# Patient Record
Sex: Female | Born: 1995
Health system: Southern US, Community
[De-identification: ages and names within clinical notes are randomized; demographics above are authoritative.]

## PROBLEM LIST (undated history)

## (undated) DIAGNOSIS — Z789 Other specified health status: Secondary | ICD-10-CM

## (undated) HISTORY — PX: NO PAST SURGERIES: SHX2092

## (undated) HISTORY — DX: Other specified health status: Z78.9

---
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
## 2018-08-21 ENCOUNTER — Ambulatory Visit (INDEPENDENT_AMBULATORY_CARE_PROVIDER_SITE_OTHER): Payer: BLUE CROSS/BLUE SHIELD

## 2018-08-21 ENCOUNTER — Other Ambulatory Visit: Payer: Self-pay

## 2018-08-21 ENCOUNTER — Encounter: Payer: Self-pay | Admitting: Emergency Medicine

## 2018-08-21 ENCOUNTER — Ambulatory Visit
Admission: EM | Admit: 2018-08-21 | Discharge: 2018-08-21 | Disposition: A | Discharge status: Home / Self Care | Payer: BLUE CROSS/BLUE SHIELD | Attending: Family Medicine | Admitting: Family Medicine

## 2018-08-21 DIAGNOSIS — M79671 Pain in right foot: Secondary | ICD-10-CM

## 2018-08-21 NOTE — Discharge Instructions (Signed)
Ice. Rest. Drink plenty of fluids. Over the counter ibuprofen. Post-operative shoe for one week. Gradually increase activity as tolerated.   Follow up with podiatry as needed for continued pain.   Follow up with your primary care physician this week as needed. Return to Urgent care for new or worsening concerns.

## 2018-08-21 NOTE — ED Provider Notes (Signed)
[facility] ____________________________________________  Time seen: Approximately 5:22 PM  I have reviewed the triage vital signs and the nursing notes.   HISTORY  Chief Complaint Foot Pain   HPI Debbie Booker is a 23 y.o. female presenting for evaluation of right foot pain present for the last 1 week.  States pain is predominantly with putting weight on right foot and with activity.  States minimal pain when at rest.  Denies pain radiation, paresthesias or skin changes.  States that she has been trying to increase running in the last few weeks, stating this is a new activity for her.  Denies any known injury or trauma.  Unresolved with rest and ice.  Denies other alleviating measures.  Denies history of same.  Reports otherwise doing well denies other complaints.  Patient's last menstrual period was 08/07/2018.  Denies pregnancy.   History reviewed. No pertinent past medical history.  There are no active problems to display for this patient.   History reviewed. No pertinent surgical history.   No current facility-administered medications for this encounter.  No current outpatient medications on file.  Allergies Patient has no known allergies.  History reviewed. No pertinent family history.  Social History Social History   Tobacco Use  . Smoking status: Never Smoker  . Smokeless tobacco: Never Used  Substance Use Topics  . Alcohol use: Never    Frequency: Never  . Drug use: Never    Review of Systems Constitutional: No fever Cardiovascular: Denies chest pain. Respiratory: Denies shortness of breath. Musculoskeletal: Positive right foot pain. Skin: Negative for rash.   ____________________________________________   PHYSICAL EXAM:  VITAL SIGNS: ED Triage Vitals  Enc Vitals Group     BP 08/21/18 1550 115/73     Pulse Rate 08/21/18 1550 60     Resp 08/21/18 1550 18     Temp 08/21/18 1550 98.3 F (36.8 C)     Temp Source 08/21/18  1550 Oral     SpO2 08/21/18 1550 100 %     Weight 08/21/18 1549 125 lb (56.7 kg)     Height 08/21/18 1549 5\' 3"  (1.6 m)     Head Circumference --      Peak Flow --      Pain Score 08/21/18 1549 6     Pain Loc --      Pain Edu? --      Excl. in [place]? --     Constitutional: Alert and oriented. Well appearing and in no acute distress. ENT      Head: Normocephalic and atraumatic. Cardiovascular: Normal rate, regular rhythm. Grossly normal heart sounds.  Good peripheral circulation. Respiratory: Normal respiratory effort without tachypnea nor retractions. Breath sounds are clear and equal bilaterally. No wheezes, rales, rhonchi. Musculoskeletal:  Bilateral pedal pulses equal and easily palpated. Except: Right dorsal midfoot at distal second metatarsal moderate tenderness to direct palpation, no swelling, no ecchymosis, skin intact, full range of motion present to right foot, right lower extremity otherwise nontender. Neurologic:  Normal speech and language.  Skin:  Skin is warm, dry and intact. No rash noted. Psychiatric: Mood and affect are normal. Speech and behavior are normal. Patient exhibits appropriate insight and judgment   ___________________________________________   LABS (all labs ordered are listed, but only abnormal results are displayed)  Labs Reviewed - No data to display ____________________________________________  RADIOLOGY  Dg Foot Complete Right  Result Date: 08/21/2018 CLINICAL DATA:  23 year old female with acute RIGHT foot pain for 1 week following running.  Initial encounter. EXAM: RIGHT FOOT COMPLETE - 3+ VIEW COMPARISON:  None. FINDINGS: There is no evidence of fracture or dislocation. There is no evidence of arthropathy or other focal bone abnormality. Soft tissues are unremarkable. IMPRESSION: Negative. Electronically Signed   By: Harmon PierJeffrey  Hu M.D.   On: 08/21/2018 16:45   ____________________________________________   PROCEDURES Procedures   INITIAL  IMPRESSION / ASSESSMENT AND PLAN / ED COURSE  Pertinent labs & imaging results that were available during my care of the patient were reviewed by me and considered in my medical decision making (see chart for details).  Well-appearing patient.  No acute distress.  Presenting for evaluation of right foot pain, denies known direct injury.  Right foot x-ray as above per radiologist, negative.  Suspect tendinitis.  Postop shoe given, encouraged ice, rest, over-the-counter ibuprofen as needed and monitoring.  Follow-up with podiatry as needed for continued pain.  Discussed follow up with Primary care physician this week. Discussed follow up and return parameters including no resolution or any worsening concerns. Patient verbalized understanding and agreed to plan.   ____________________________________________   FINAL CLINICAL IMPRESSION(S) / ED DIAGNOSES  Final diagnoses:  Right foot pain     ED Discharge Orders    None       Note: This dictation was prepared with Dragon dictation along with smaller phrase technology. Any transcriptional errors that result from this process are unintentional.         Renford DillsMiller, Israel Wunder, NP 08/21/18 1847

## 2018-08-21 NOTE — ED Triage Notes (Signed)
Patient c/o right foot pain that started 1 week ago after she was running. She denies injury.

## 2018-12-15 ENCOUNTER — Other Ambulatory Visit: Payer: Self-pay

## 2018-12-15 DIAGNOSIS — Z20822 Contact with and (suspected) exposure to covid-19: Secondary | ICD-10-CM

## 2018-12-16 LAB — NOVEL CORONAVIRUS, NAA: SARS-CoV-2, NAA: NOT DETECTED

## 2018-12-21 ENCOUNTER — Other Ambulatory Visit: Payer: Self-pay

## 2018-12-21 DIAGNOSIS — Z20822 Contact with and (suspected) exposure to covid-19: Secondary | ICD-10-CM

## 2018-12-22 LAB — NOVEL CORONAVIRUS, NAA: SARS-CoV-2, NAA: DETECTED — AB

## 2018-12-28 ENCOUNTER — Other Ambulatory Visit: Payer: Self-pay

## 2018-12-28 ENCOUNTER — Encounter: Payer: Self-pay | Admitting: Emergency Medicine

## 2018-12-28 ENCOUNTER — Ambulatory Visit
Admission: EM | Admit: 2018-12-28 | Discharge: 2018-12-28 | Disposition: A | Discharge status: Home / Self Care | Payer: BC Managed Care – PPO | Attending: Family Medicine | Admitting: Family Medicine

## 2018-12-28 DIAGNOSIS — R21 Rash and other nonspecific skin eruption: Secondary | ICD-10-CM | POA: Diagnosis not present

## 2018-12-28 DIAGNOSIS — U071 COVID-19: Secondary | ICD-10-CM | POA: Diagnosis not present

## 2018-12-28 DIAGNOSIS — J988 Other specified respiratory disorders: Secondary | ICD-10-CM

## 2018-12-28 DIAGNOSIS — M2540 Effusion, unspecified joint: Secondary | ICD-10-CM | POA: Diagnosis not present

## 2018-12-28 MED ORDER — PREDNISONE 10 MG (21) PO TBPK: ORAL_TABLET | ORAL | 0 refills | Status: DC

## 2018-12-28 NOTE — ED Triage Notes (Addendum)
Patient c/o itchy rash x 1 day ago on face, arms and legs. She started taking Benadryl which helped but states now her hands and feet started swelling this morning.  Patient tested positive on 08/27 for COVID

## 2018-12-28 NOTE — ED Provider Notes (Signed)
[facility]    CSN: [id number] Arrival date & time: 12/28/18  1720  History   Chief Complaint Chief Complaint  Patient presents with  . Rash  . Joint Swelling   HPI  23 year old female presents with the above complaints.  Patient has recent testing positive for COVID-19.  She states that she has now developed a rash on her face, arms, and legs which started yesterday.  She has taken Benadryl with resolution.  She has no rash at this time.  Patient also states that this morning she developed swelling of her hands and feet.  She reports that they feel tight and she has pain when she moves her fingers and toes.  No fever.  No known inciting factor other than COVID-19.  She is unsure if this is symptoms of COVID-19.  Pain is currently 4/10 in severity.  No reports of redness.  No other associated symptoms.  No other complaints.  History reviewed as below. PMH:  No significant PMH.  Surgical Hx: None   Home Medications    Prior to Admission medications   Medication Sig Start Date End Date Taking? Authorizing Provider  predniSONE (STERAPRED UNI-PAK 21 TAB) 10 MG (21) TBPK tablet 6 tablets on day 1; decrease by 1 tablet daily until gone. 12/28/18   Coral Spikes, DO   Social History Social History   Tobacco Use  . Smoking status: Never Smoker  . Smokeless tobacco: Never Used  Substance Use Topics  . Alcohol use: Never    Frequency: Never  . Drug use: Never     Allergies   Patient has no known allergies.   Review of Systems Review of Systems  Musculoskeletal: Positive for joint swelling.  Skin: Positive for rash.   Physical Exam Triage Vital Signs ED Triage Vitals  Enc Vitals Group     BP 12/28/18 1735 106/69     Pulse Rate 12/28/18 1735 75     Resp 12/28/18 1735 18     Temp 12/28/18 1735 99 F (37.2 C)     Temp Source 12/28/18 1735 Oral     SpO2 12/28/18 1735 100 %     Weight 12/28/18 1733 127 lb (57.6 kg)     Height 12/28/18 1733 5\' 3"  (1.6 m)   Head Circumference --      Peak Flow --      Pain Score 12/28/18 1733 4     Pain Loc --      Pain Edu? --      Excl. in [place]? --    Updated Vital Signs BP 106/69 (BP Location: Right Arm)   Pulse 75   Temp 99 F (37.2 C) (Oral)   Resp 18   Ht 5\' 3"  (1.6 m)   Wt 57.6 kg   LMP 12/07/2018   SpO2 100%   BMI 22.50 kg/m   Visual Acuity Right Eye Distance:   Left Eye Distance:   Bilateral Distance:    Right Eye Near:   Left Eye Near:    Bilateral Near:     Physical Exam Vitals signs and nursing note reviewed.  Constitutional:      General: She is not in acute distress.    Appearance: Normal appearance. She is not ill-appearing.  HENT:     Head: Normocephalic and atraumatic.  Eyes:     General:        Right eye: No discharge.        Left eye: No discharge.  Conjunctiva/sclera: Conjunctivae normal.  Cardiovascular:     Rate and Rhythm: Normal rate and regular rhythm.     Heart sounds: No murmur.  Pulmonary:     Effort: Pulmonary effort is normal.     Breath sounds: Normal breath sounds. No wheezing, rhonchi or rales.  Musculoskeletal:     Comments: No joint swelling or erythema.  Skin:    General: Skin is warm.     Findings: No rash.  Neurological:     Mental Status: She is alert.  Psychiatric:        Mood and Affect: Mood normal.        Behavior: Behavior normal.    UC Treatments / Results  Labs (all labs ordered are listed, but only abnormal results are displayed) Labs Reviewed - No data to display  EKG   Radiology No results found.  Procedures Procedures (including critical care time)  Medications Ordered in UC Medications - No data to display  Initial Impression / Assessment and Plan / UC Course  I have reviewed the triage vital signs and the nursing notes.  Pertinent labs & imaging results that were available during my care of the patient were reviewed by me and considered in my medical decision making (see chart for details).     23 year old female presents with reported rash and joint swelling.  No appreciable abnormalities on exam.  Discussed that this is likely related to COVID-19.  Prednisone if joint pain and swelling persists or worsens.  Final Clinical Impressions(s) / UC Diagnoses   Final diagnoses:  COVID-19  Joint swelling  Rash and nonspecific skin eruption     Discharge Instructions     Prednisone if pain/swelling worsens.  Take care  Dr. Adriana Simasook    ED Prescriptions    Medication Sig Dispense Auth. Provider   predniSONE (STERAPRED UNI-PAK 21 TAB) 10 MG (21) TBPK tablet 6 tablets on day 1; decrease by 1 tablet daily until gone. 21 tablet Tommie Samsook, Jakhia Buxton G, DO     Controlled Substance Prescriptions [place] Controlled Substance Registry consulted? Not Applicable   Tommie SamsCook, Ulas Zuercher G, DO 12/28/18 1921

## 2018-12-28 NOTE — Discharge Instructions (Signed)
Prednisone if pain/swelling worsens.  Take care  Dr. Lacinda Axon

## 2019-04-03 ENCOUNTER — Telehealth: Payer: Self-pay

## 2019-04-03 NOTE — Telephone Encounter (Signed)
Pt called after hour nurse 04/02/19 7:19pm stating she has had her IUD for 34yrs.  Now her menstrual cycle is off schedule and is bleeding at random times.  First noted last month.  Denies fever/pain.  [phone]

## 2019-04-03 NOTE — Telephone Encounter (Signed)
Normal to have irregular bleeding with IUD. Also needs to be checked for STD, IUD placement, etc, which can cause sx, too. She needs appt/annual.

## 2019-04-03 NOTE — Telephone Encounter (Signed)
Please advise 

## 2019-04-03 NOTE — Telephone Encounter (Signed)
Called pt, no answer, LVMTRC. 

## 2019-04-03 NOTE — Telephone Encounter (Signed)
Patient aware and has scheduled annual.

## 2019-05-04 ENCOUNTER — Other Ambulatory Visit (HOSPITAL_COMMUNITY)
Admission: RE | Admit: 2019-05-04 | Discharge: 2019-05-04 | Disposition: A | Discharge status: Home / Self Care | Payer: BC Managed Care – PPO | Source: Ambulatory Visit | Attending: Obstetrics and Gynecology | Admitting: Obstetrics and Gynecology

## 2019-05-04 ENCOUNTER — Other Ambulatory Visit: Payer: Self-pay

## 2019-05-04 ENCOUNTER — Ambulatory Visit (INDEPENDENT_AMBULATORY_CARE_PROVIDER_SITE_OTHER): Payer: BC Managed Care – PPO | Admitting: Obstetrics and Gynecology

## 2019-05-04 ENCOUNTER — Encounter: Payer: Self-pay | Admitting: Obstetrics and Gynecology

## 2019-05-04 VITALS — BP 98/62 | HR 73 | Ht 63.0 in | Wt 129.0 lb

## 2019-05-04 DIAGNOSIS — N921 Excessive and frequent menstruation with irregular cycle: Secondary | ICD-10-CM

## 2019-05-04 DIAGNOSIS — Z124 Encounter for screening for malignant neoplasm of cervix: Secondary | ICD-10-CM | POA: Diagnosis present

## 2019-05-04 DIAGNOSIS — Z01419 Encounter for gynecological examination (general) (routine) without abnormal findings: Secondary | ICD-10-CM

## 2019-05-04 DIAGNOSIS — Z113 Encounter for screening for infections with a predominantly sexual mode of transmission: Secondary | ICD-10-CM | POA: Diagnosis present

## 2019-05-04 DIAGNOSIS — Z975 Presence of (intrauterine) contraceptive device: Secondary | ICD-10-CM

## 2019-05-04 DIAGNOSIS — Z30431 Encounter for routine checking of intrauterine contraceptive device: Secondary | ICD-10-CM

## 2019-05-04 NOTE — Patient Instructions (Signed)
I value your feedback and entrusting us with your care. If you get a [facility] patient survey, I would appreciate you taking the time to let us know about your experience today. Thank you!  As of April 05, 2019, your lab results will be released to your MyChart immediately, before I even have a chance to see them. Please give me time to review them and contact you if there are any abnormalities. Thank you for your patience.  

## 2019-05-04 NOTE — Progress Notes (Signed)
PCP:  Patient, No Pcp Per   Chief Complaint  Patient presents with  . Gynecologic Exam    frequent menses/headaches     HPI:      [name] is a 24 y.o. G0P0000 who  Patient's last menstrual period was 05/04/2019., presents today for her NP annual examination.  Her menses are regular every 28-30 days, lasting 5 days, light flow, with IUD.  Dysmenorrhea none. She does not have intermenstrual bleeding usually, but did have 1 episode last cycle. Sx occurred after sex, had mild dyspareunia, and lasted a few days, light as well. Had neg UPT. No sx since. Currently on menses.  Pt also with headaches daily with this menses. Improved with ibup. Unusual for pt.   Sex activity: single partner, contraception - IUD. Kyleena placed 3 yrs ago. Pt states strings were cut very short; had GYN u/s in past to confirm placement. Last Pap: never due to age Hx of STDs: none  There is no FH of breast cancer. There is no FH of ovarian cancer. The patient does not do self-breast exams.  Tobacco use: The patient denies current or previous tobacco use. Alcohol use: none No drug use.  Exercise: moderately active  She does not get adequate calcium and Vitamin D in her diet. Gardasil not done.  There are no problems to display for this patient.   Past Surgical History:  Procedure Laterality Date  . NO PAST SURGERIES      Family History  Problem Relation Age of Onset  . Breast cancer Neg Hx   . Ovarian cancer Neg Hx     Social History   Socioeconomic History  . Marital status: Single    Spouse name: Not on file  . Number of children: Not on file  . Years of education: Not on file  . Highest education level: Not on file  Occupational History  . Not on file  Tobacco Use  . Smoking status: Never Smoker  . Smokeless tobacco: Never Used  Substance and Sexual Activity  . Alcohol use: Never  . Drug use: Never  . Sexual activity: Yes    Birth control/protection: I.U.D.  Other  Topics Concern  . Not on file  Social History Narrative  . Not on file   Social Determinants of Health   Financial Resource Strain:   . Difficulty of Paying Living Expenses: Not on file  Food Insecurity:   . Worried About Programme researcher, broadcasting/film/video in the Last Year: Not on file  . Ran Out of Food in the Last Year: Not on file  Transportation Needs:   . Lack of Transportation (Medical): Not on file  . Lack of Transportation (Non-Medical): Not on file  Physical Activity:   . Days of Exercise per Week: Not on file  . Minutes of Exercise per Session: Not on file  Stress:   . Feeling of Stress : Not on file  Social Connections:   . Frequency of Communication with Friends and Family: Not on file  . Frequency of Social Gatherings with Friends and Family: Not on file  . Attends Religious Services: Not on file  . Active Member of Clubs or Organizations: Not on file  . Attends Banker Meetings: Not on file  . Marital Status: Not on file  Intimate Partner Violence:   . Fear of Current or Ex-Partner: Not on file  . Emotionally Abused: Not on file  . Physically Abused: Not on file  .  Sexually Abused: Not on file     Current Outpatient Medications:  .  Levonorgestrel (KYLEENA) 19.5 MG IUD, by Intrauterine route., Disp: , Rfl:  .  predniSONE (STERAPRED UNI-PAK 21 TAB) 10 MG (21) TBPK tablet, 6 tablets on day 1; decrease by 1 tablet daily until gone., Disp: 21 tablet, Rfl: 0     ROS:  Review of Systems  Constitutional: Negative for fatigue, fever and unexpected weight change.  Respiratory: Negative for cough, shortness of breath and wheezing.   Cardiovascular: Negative for chest pain, palpitations and leg swelling.  Gastrointestinal: Negative for blood in stool, constipation, diarrhea, nausea and vomiting.  Endocrine: Negative for cold intolerance, heat intolerance and polyuria.  Genitourinary: Positive for menstrual problem. Negative for dyspareunia, dysuria, flank pain,  frequency, genital sores, hematuria, pelvic pain, urgency, vaginal bleeding, vaginal discharge and vaginal pain.  Musculoskeletal: Negative for back pain, joint swelling and myalgias.  Skin: Negative for rash.  Neurological: Positive for headaches. Negative for dizziness, syncope, light-headedness and numbness.  Hematological: Negative for adenopathy.  Psychiatric/Behavioral: Negative for agitation, confusion, sleep disturbance and suicidal ideas. The patient is not nervous/anxious.   BREAST: No symptoms   Objective: BP 98/62 (BP Location: Left Arm, Patient Position: Sitting, Cuff Size: Normal)   Pulse 73   Ht 5\' 3"  (1.6 m)   Wt 129 lb (58.5 kg)   LMP 05/04/2019   BMI 22.85 kg/m    Physical Exam Constitutional:      Appearance: She is well-developed.  Genitourinary:     Vulva, cervix, uterus, right adnexa and left adnexa normal.     No vulval lesion or tenderness noted.     Vaginal bleeding present.     No vaginal discharge, erythema or tenderness.     No cervical polyp.     No IUD strings visualized.     Uterus is not enlarged or tender.     No right or left adnexal mass present.     Right adnexa not tender.     Left adnexa not tender.     Genitourinary Comments: IUD STRINGS FELT IN CX OS, BUT NOT SEEN  Neck:     Thyroid: No thyromegaly.  Cardiovascular:     Rate and Rhythm: Normal rate and regular rhythm.     Heart sounds: Normal heart sounds. No murmur.  Pulmonary:     Effort: Pulmonary effort is normal.     Breath sounds: Normal breath sounds.  Chest:     Breasts:        Right: No mass, nipple discharge, skin change or tenderness.        Left: No mass, nipple discharge, skin change or tenderness.  Abdominal:     Palpations: Abdomen is soft.     Tenderness: There is no abdominal tenderness. There is no guarding.  Musculoskeletal:        General: Normal range of motion.     Cervical back: Normal range of motion.  Neurological:     General: No focal deficit  present.     Mental Status: She is alert and oriented to person, place, and time.     Cranial Nerves: No cranial nerve deficit.  Skin:    General: Skin is warm and dry.  Psychiatric:        Mood and Affect: Mood normal.        Behavior: Behavior normal.        Thought Content: Thought content normal.        Judgment: Judgment normal.  Vitals reviewed.     Assessment/Plan: Encounter for annual routine gynecological examination  Cervical cancer screening - Plan: [facility] P[facility]  Screening for STD (sexually transmitted disease) - Plan: [facility] P[facility]  Encounter for routine checking of intrauterine contraceptive device (IUD); IUD in place.   Breakthrough bleeding with IUD--Neg home UPT. Check STDs. If neg and sx resolved, reassurance. If sx recur, will check GYN u/s. Pt to f/u prn.            GYN counsel adequate intake of calcium and vitamin D, diet and exercise, Gardasil discussed with handout given     F/U  Return in about 1 year (around 05/03/2020).  Skiler Olden B. Jazir Newey, PA-C 05/04/2019 11:00 AM

## 2019-05-10 LAB — CYTOLOGY - PAP
Adequacy: ABSENT
Chlamydia: NEGATIVE
Comment: NEGATIVE
Comment: NORMAL
Diagnosis: NEGATIVE
Neisseria Gonorrhea: NEGATIVE

## 2019-05-30 ENCOUNTER — Telehealth: Payer: Self-pay

## 2019-05-30 NOTE — Telephone Encounter (Signed)
Pt called after hour nurse 05/29/19 9:45pm c/o noticed she was bleedingafter sex.  Saw MD who stated her IUD could have been the cause.  Notices cramping and bleeding during sex.  [phone]  After hour nurse adv pt to call us and sched appt. Pt tx to front desk to be scheduled.

## 2020-04-15 ENCOUNTER — Encounter: Payer: Self-pay | Admitting: Emergency Medicine

## 2020-04-15 ENCOUNTER — Ambulatory Visit
Admission: RE | Admit: 2020-04-15 | Discharge: 2020-04-15 | Discharge status: Absent without leave | Payer: Self-pay | Source: Ambulatory Visit

## 2020-04-15 ENCOUNTER — Ambulatory Visit
Admission: EM | Admit: 2020-04-15 | Discharge: 2020-04-15 | Disposition: A | Discharge status: Home / Self Care | Payer: BC Managed Care – PPO | Attending: Sports Medicine | Admitting: Sports Medicine

## 2020-04-15 ENCOUNTER — Other Ambulatory Visit: Payer: Self-pay

## 2020-04-15 DIAGNOSIS — L509 Urticaria, unspecified: Secondary | ICD-10-CM | POA: Diagnosis not present

## 2020-04-15 DIAGNOSIS — R21 Rash and other nonspecific skin eruption: Secondary | ICD-10-CM | POA: Diagnosis not present

## 2020-04-15 DIAGNOSIS — T7840XA Allergy, unspecified, initial encounter: Secondary | ICD-10-CM

## 2020-04-15 MED ORDER — ZYRTEC ALLERGY 10 MG PO CAPS: 10.0000 mg | ORAL_CAPSULE | Freq: Every day | ORAL | 0 refills | Status: DC

## 2020-04-15 MED ORDER — TRIAMCINOLONE ACETONIDE 0.1 % EX CREA: 1.0000 "application " | TOPICAL_CREAM | Freq: Two times a day (BID) | CUTANEOUS | 0 refills | Status: DC

## 2020-04-15 NOTE — ED Triage Notes (Signed)
Patient c/o hives x 3 days. She states she takes Benadryl and they resolve.

## 2020-04-15 NOTE — Discharge Instructions (Addendum)
I recommended putting her on a allergy medication for 30 days and to monitor her symptoms.  I also recommended that she keep a food log, as well as a symptom log to see if there is a reason why she is having the symptoms.  She could possibly see an allergist or dermatologist if she could find the root cause for her rash. We will put her on a steroid cream, triamcinolone as prescribed. Follow-up here as needed.

## 2020-04-15 NOTE — ED Provider Notes (Signed)
[facility]    CSN: [id number] Arrival date & time: 04/15/20  1702      History   Chief Complaint Chief Complaint  Patient presents with  . Rash    HPI Debbie Booker is a 24 y.o. female.   Pleasant 24 year old female who presents for evaluation of 3 to 4 days of intermittent hives and rash.  She does not fully understand what is precipitating them.  She has no history of psoriasis or eczema personally or in her family.  The hives seem to come and go and do respond to oral Benadryl.  When she gets them she has no shortness of breath and no evidence of any anaphylaxis.  No new detergents, body sprays, deodorants, or perfumes.  She does not have any food allergies.  She is not sure if there is any foods that are precipitating her rash.  Of note, she said that she has had a cough for about 2 weeks.  It is not causing her any problems at night.  No fevers.  A little bit of a sore throat secondary to the cough.  Not sure if it is related.  She has not been treated for it and she has not taken any antibiotics for.     History reviewed. No pertinent past medical history.  There are no problems to display for this patient.   Past Surgical History:  Procedure Laterality Date  . NO PAST SURGERIES      OB History    Gravida  0   Para  0   Term  0   Preterm  0   AB  0   Living  0     SAB  0   IAB  0   Ectopic  0   Multiple  0   Live Births  0            Home Medications    Prior to Admission medications   Medication Sig Start Date End Date Taking? Authorizing Provider  Levonorgestrel (KYLEENA) 19.5 MG IUD by Intrauterine route. 03/01/16  Yes [provider]  Cetirizine HCl (ZYRTEC ALLERGY) 10 MG CAPS Take 1 capsule (10 mg total) by mouth daily. 04/15/20   Delton See, MD  predniSONE (STERAPRED UNI-PAK 21 TAB) 10 MG (21) TBPK tablet 6 tablets on day 1; decrease by 1 tablet daily until gone. 12/28/18   Tommie Sams, DO   triamcinolone (KENALOG) 0.1 % Apply 1 application topically 2 (two) times daily. 04/15/20   Delton See, MD    Family History Family History  Problem Relation Age of Onset  . Healthy Mother   . Healthy Father   . Breast cancer Neg Hx   . Ovarian cancer Neg Hx     Social History Social History   Tobacco Use  . Smoking status: Never Smoker  . Smokeless tobacco: Never Used  Vaping Use  . Vaping Use: Never used  Substance Use Topics  . Alcohol use: Never  . Drug use: Never     Allergies   Patient has no known allergies.   Review of Systems Review of Systems  Constitutional: Negative for activity change, appetite change, chills, diaphoresis, fatigue and fever.  HENT: Positive for sore throat. Negative for congestion, ear pain, rhinorrhea, sinus pressure and sinus pain.   Eyes: Negative for pain.  Respiratory: Positive for cough. Negative for chest tightness.   Cardiovascular: Negative for chest pain.  Gastrointestinal: Negative for abdominal pain.  Genitourinary: Negative for  dysuria.  Skin: Positive for rash. Negative for color change, pallor and wound.  Neurological: Negative for headaches.     Physical Exam Triage Vital Signs ED Triage Vitals  Enc Vitals Group     BP 04/15/20 1731 (!) 128/92     Pulse Rate 04/15/20 1731 75     Resp 04/15/20 1731 18     Temp 04/15/20 1731 98.9 F (37.2 C)     Temp Source 04/15/20 1731 Oral     SpO2 04/15/20 1731 100 %     Weight 04/15/20 1729 128 lb (58.1 kg)     Height 04/15/20 1729 5\' 3"  (1.6 m)     Head Circumference --      Peak Flow --      Pain Score 04/15/20 1729 0     Pain Loc --      Pain Edu? --      Excl. in GC? --    No data found.  Updated Vital Signs BP (!) 128/92 (BP Location: Right Arm)   Pulse 75   Temp 98.9 F (37.2 C) (Oral)   Resp 18   Ht 5\' 3"  (1.6 m)   Wt 58.1 kg   LMP 03/26/2020   SpO2 100%   BMI 22.67 kg/m   Visual Acuity Right Eye Distance:   Left Eye Distance:   Bilateral  Distance:    Right Eye Near:   Left Eye Near:    Bilateral Near:     Physical Exam Vitals and nursing note reviewed.  Constitutional:      General: She is not in acute distress.    Appearance: Normal appearance. She is not ill-appearing, toxic-appearing or diaphoretic.  HENT:     Head: Normocephalic and atraumatic.  Skin:    General: Skin is warm and dry.     Comments: Rash and hives are not present today in the office.  She reports she took Benadryl short time before arriving and they resolved.  Neurological:     General: No focal deficit present.     Mental Status: She is alert and oriented to person, place, and time.      UC Treatments / Results  Labs (all labs ordered are listed, but only abnormal results are displayed) Labs Reviewed - No data to display  EKG   Radiology No results found.  Procedures Procedures (including critical care time)  Medications Ordered in UC Medications - No data to display  Initial Impression / Assessment and Plan / UC Course  I have reviewed the triage vital signs and the nursing notes.  Pertinent labs & imaging results that were available during my care of the patient were reviewed by me and considered in my medical decision making (see chart for details).  Clinical impression: 1.  Rash and hives that come on intermittently and without rhyme or reason.  She has had that for about 3 to 4 days and it has responded to Benadryl 2.  Cough for 2 weeks without any fever shakes or chills.  Probably a viral process.  Currently asymptomatic.  Treatment plan:  1 the findings and treatment plan were discussed in detail with the patient.  Patient was in agreement. 2.  I recommended putting her on a allergy medication for 30 days and to monitor her symptoms.  I also recommended that she keep a food log, as well as a symptom log to see if there is a reason why she is having the symptoms.  She could possibly see  an allergist or dermatologist if she  could find the root cause for her rash. 3.  We will put her on a steroid cream, triamcinolone as prescribed. 4.  Follow-up here as needed.     Final Clinical Impressions(s) / UC Diagnoses   Final diagnoses:  Rash  Hives  Allergic reaction, initial encounter     Discharge Instructions     I recommended putting her on a allergy medication for 30 days and to monitor her symptoms.  I also recommended that she keep a food log, as well as a symptom log to see if there is a reason why she is having the symptoms.  She could possibly see an allergist or dermatologist if she could find the root cause for her rash. We will put her on a steroid cream, triamcinolone as prescribed. Follow-up here as needed.    ED Prescriptions    Medication Sig Dispense Auth. Provider   Cetirizine HCl (ZYRTEC ALLERGY) 10 MG CAPS Take 1 capsule (10 mg total) by mouth daily. 30 capsule Delton See, MD   triamcinolone (KENALOG) 0.1 % Apply 1 application topically 2 (two) times daily. 30 g Delton See, MD     PDMP not reviewed this encounter.   Delton See, MD 04/15/20 1759

## 2020-04-19 ENCOUNTER — Emergency Department
Admission: EM | Admit: 2020-04-19 | Discharge: 2020-04-19 | Disposition: A | Discharge status: Home / Self Care | Payer: BC Managed Care – PPO | Attending: Student in an Organized Health Care Education/Training Program | Admitting: Student in an Organized Health Care Education/Training Program

## 2020-04-19 ENCOUNTER — Other Ambulatory Visit: Payer: Self-pay

## 2020-04-19 DIAGNOSIS — L509 Urticaria, unspecified: Secondary | ICD-10-CM | POA: Diagnosis not present

## 2020-04-19 MED ORDER — PREDNISONE 20 MG PO TABS
40.0000 mg | ORAL_TABLET | Freq: Once | ORAL | Status: AC
  Administered 2020-04-19: 40 mg via ORAL
  Filled 2020-04-19: qty 2

## 2020-04-19 MED ORDER — PREDNISONE 20 MG PO TABS: 40.0000 mg | ORAL_TABLET | Freq: Every day | ORAL | 0 refills | Status: AC

## 2020-04-19 MED ORDER — PREDNISONE 20 MG PO TABS: 60.0000 mg | ORAL_TABLET | Freq: Once | ORAL | Status: DC

## 2020-04-19 NOTE — ED Provider Notes (Signed)
[facility] Emergency Department Provider Note    Event Date/Time   First MD Initiated Contact with Patient 04/19/20 1302     (approximate)  I have reviewed the triage vital signs and the nursing notes.   HISTORY  Chief Complaint Urticaria    HPI Debbie Booker is a 24 y.o. female presents to the ER for waxing and waning intermittent urticaria bilateral lower extremities.  Symptoms have resolved.  Cannot identify any exacerbating or aggravating factors.  No new detergents soaps or perfumes.  No one else in the household has these.  Denies any throat discomfort or trouble swallowing.  Symptoms been going on for several days.    History reviewed. No pertinent past medical history. Family History  Problem Relation Age of Onset  . Healthy Mother   . Healthy Father   . Breast cancer Neg Hx   . Ovarian cancer Neg Hx    Past Surgical History:  Procedure Laterality Date  . NO PAST SURGERIES     There are no problems to display for this patient.     Prior to Admission medications   Medication Sig Start Date End Date Taking? Authorizing Provider  Cetirizine HCl (ZYRTEC ALLERGY) 10 MG CAPS Take 1 capsule (10 mg total) by mouth daily. 04/15/20   Delton See, MD  Levonorgestrel [facility]) 19.5 MG IUD by Intrauterine route. 03/01/16   [provider]  predniSONE (DELTASONE) 20 MG tablet Take 2 tablets (40 mg total) by mouth daily for 4 days. 04/19/20 04/23/20  Willy Eddy, MD  triamcinolone (KENALOG) 0.1 % Apply 1 application topically 2 (two) times daily. 04/15/20   Delton See, MD    Allergies Patient has no known allergies.    Social History Social History   Tobacco Use  . Smoking status: Never Smoker  . Smokeless tobacco: Never Used  Vaping Use  . Vaping Use: Never used  Substance Use Topics  . Alcohol use: Never  . Drug use: Never    Review of Systems Patient denies headaches, rhinorrhea, blurry vision,  numbness, shortness of breath, chest pain, edema, cough, abdominal pain, nausea, vomiting, diarrhea, dysuria, fevers, rashes or hallucinations unless otherwise stated above in HPI. ____________________________________________   PHYSICAL EXAM:  VITAL SIGNS: Vitals:   04/19/20 1208  BP: 137/79  Pulse: 94  Resp: 18  Temp: 98.4 F (36.9 C)  SpO2: 100%    Constitutional: Alert and oriented.  Eyes: Conjunctivae are normal.  Head: Atraumatic. Nose: No congestion/rhinnorhea. Mouth/Throat: Mucous membranes are moist.   Neck: No stridor. Painless ROM.  Cardiovascular: Normal rate, regular rhythm. Grossly normal heart sounds.  Good peripheral circulation. Respiratory: Normal respiratory effort.  No retractions. Lungs CTAB. Gastrointestinal: Soft and nontender. No distention. No abdominal bruits. No CVA tenderness. Genitourinary:  Musculoskeletal: No lower extremity tenderness nor edema.  No joint effusions. Neurologic:  Normal speech and language. No gross focal neurologic deficits are appreciated. No facial droop Skin:  Skin is warm, dry and intact. No rash noted. Psychiatric: Mood and affect are normal. Speech and behavior are normal.  ____________________________________________   LABS (all labs ordered are listed, but only abnormal results are displayed)  No results found for this or any previous visit (from the past 24 hour(s)). ____________________________________________ ____________________________________________  RADIOLOGY   ____________________________________________   PROCEDURES  Procedure(s) performed:  Procedures    Critical Care performed: no ____________________________________________   INITIAL IMPRESSION / ASSESSMENT AND PLAN / ED COURSE  Pertinent labs & imaging results that were available during  my care of the patient were reviewed by me and considered in my medical decision making (see chart for details).   DDX: Urticaria, anaphylaxis, bug  bites  Debbie Booker is a 24 y.o. who presents to the ED with presentation as described above.  She is well-appearing in no acute distress.  No significant urticaria at this time but has pictures of it over the past several days.  Will place on prednisone.  Negative Nikolsky.  No mucosal abnormality.  Stable appropriate for outpatient follow-up and allergy referral.     The patient was evaluated in Emergency Department today for the symptoms described in the history of present illness. He/she was evaluated in the context of the global COVID-19 pandemic, which necessitated consideration that the patient might be at risk for infection with the SARS-CoV-2 virus that causes COVID-19. Institutional protocols and algorithms that pertain to the evaluation of patients at risk for COVID-19 are in a state of rapid change based on information released by regulatory bodies including the CDC and federal and state organizations. These policies and algorithms were followed during the patient's care in the ED.  As part of my medical decision making, I reviewed the following data within the electronic MEDICAL RECORD NUMBER Nursing notes reviewed and incorporated, Labs reviewed, notes from prior ED visits and [place] Controlled Substance Database   ____________________________________________   FINAL CLINICAL IMPRESSION(S) / ED DIAGNOSES  Final diagnoses:  Urticaria      NEW MEDICATIONS STARTED DURING THIS VISIT:  New Prescriptions   PREDNISONE (DELTASONE) 20 MG TABLET    Take 2 tablets (40 mg total) by mouth daily for 4 days.     Note:  This document was prepared using Dragon voice recognition software and may include unintentional dictation errors.    Willy Eddy, MD 04/19/20 [phone]

## 2020-04-19 NOTE — ED Triage Notes (Signed)
Pt comes with hives for a week. Hives on her legs. No sob, swelling, etc. Denies any new products. NAD.

## 2020-09-25 NOTE — Progress Notes (Deleted)
PCP:  Patient, No Pcp Per (Inactive)   No chief complaint on file.    HPI:      [name] is a 25 y.o. G0P0000 who  No LMP recorded., presents today for her NP annual examination.  Her menses are regular every 28-30 days, lasting 5 days, light flow, with IUD.  Dysmenorrhea none. She does not have intermenstrual bleeding usually, but did have 1 episode last cycle. Sx occurred after sex, had mild dyspareunia, and lasted a few days, light as well. Had neg UPT. No sx since. Currently on menses.  Pt also with headaches daily with this menses. Improved with ibup. Unusual for pt.   Sex activity: single partner, contraception - IUD. Kyleena placed 4 yrs ago. Pt states strings were cut very short; had GYN u/s in past to confirm placement. Last Pap: 05/14/19 Results were normal Hx of STDs: none  There is no FH of breast cancer. There is no FH of ovarian cancer. The patient does not do self-breast exams.  Tobacco use: The patient denies current or previous tobacco use. Alcohol use: none No drug use.  Exercise: moderately active  She does not get adequate calcium and Vitamin D in her diet. Gardasil not done.  There are no problems to display for this patient.   Past Surgical History:  Procedure Laterality Date  . NO PAST SURGERIES      Family History  Problem Relation Age of Onset  . Healthy Mother   . Healthy Father   . Breast cancer Neg Hx   . Ovarian cancer Neg Hx     Social History   Socioeconomic History  . Marital status: Single    Spouse name: Not on file  . Number of children: Not on file  . Years of education: Not on file  . Highest education level: Not on file  Occupational History  . Not on file  Tobacco Use  . Smoking status: Never Smoker  . Smokeless tobacco: Never Used  Vaping Use  . Vaping Use: Never used  Substance and Sexual Activity  . Alcohol use: Never  . Drug use: Never  . Sexual activity: Yes    Birth control/protection: I.U.D.   Other Topics Concern  . Not on file  Social History Narrative   ** Merged History Encounter **       Social Determinants of Health   Financial Resource Strain: Not on file  Food Insecurity: Not on file  Transportation Needs: Not on file  Physical Activity: Not on file  Stress: Not on file  Social Connections: Not on file  Intimate Partner Violence: Not on file     Current Outpatient Medications:  .  Cetirizine HCl (ZYRTEC ALLERGY) 10 MG CAPS, Take 1 capsule (10 mg total) by mouth daily., Disp: 30 capsule, Rfl: 0 .  Levonorgestrel (KYLEENA) 19.5 MG IUD, by Intrauterine route., Disp: , Rfl:  .  triamcinolone (KENALOG) 0.1 %, Apply 1 application topically 2 (two) times daily., Disp: 30 g, Rfl: 0     ROS:  Review of Systems  Constitutional: Negative for fatigue, fever and unexpected weight change.  Respiratory: Negative for cough, shortness of breath and wheezing.   Cardiovascular: Negative for chest pain, palpitations and leg swelling.  Gastrointestinal: Negative for blood in stool, constipation, diarrhea, nausea and vomiting.  Endocrine: Negative for cold intolerance, heat intolerance and polyuria.  Genitourinary: Positive for menstrual problem. Negative for dyspareunia, dysuria, flank pain, frequency, genital sores, hematuria, pelvic pain, urgency, vaginal  bleeding, vaginal discharge and vaginal pain.  Musculoskeletal: Negative for back pain, joint swelling and myalgias.  Skin: Negative for rash.  Neurological: Positive for headaches. Negative for dizziness, syncope, light-headedness and numbness.  Hematological: Negative for adenopathy.  Psychiatric/Behavioral: Negative for agitation, confusion, sleep disturbance and suicidal ideas. The patient is not nervous/anxious.   BREAST: No symptoms   Objective: There were no vitals taken for this visit.   Physical Exam Constitutional:      Appearance: She is well-developed.  Genitourinary:     Vulva normal.      Genitourinary Comments: IUD STRINGS FELT IN CX OS, BUT NOT SEEN     Vaginal bleeding present.     No vaginal discharge, erythema or tenderness.      Right Adnexa: not tender and no mass present.    Left Adnexa: not tender and no mass present.    No cervical polyp.     No IUD strings visualized.     Uterus is not enlarged or tender.  Breasts:     Right: No mass, nipple discharge, skin change or tenderness.     Left: No mass, nipple discharge, skin change or tenderness.    Neck:     Thyroid: No thyromegaly.  Cardiovascular:     Rate and Rhythm: Normal rate and regular rhythm.     Heart sounds: Normal heart sounds. No murmur heard.   Pulmonary:     Effort: Pulmonary effort is normal.     Breath sounds: Normal breath sounds.  Abdominal:     Palpations: Abdomen is soft.     Tenderness: There is no abdominal tenderness. There is no guarding.  Musculoskeletal:        General: Normal range of motion.     Cervical back: Normal range of motion.  Neurological:     General: No focal deficit present.     Mental Status: She is alert and oriented to person, place, and time.     Cranial Nerves: No cranial nerve deficit.  Skin:    General: Skin is warm and dry.  Psychiatric:        Mood and Affect: Mood normal.        Behavior: Behavior normal.        Thought Content: Thought content normal.        Judgment: Judgment normal.  Vitals reviewed.     Assessment/Plan: Encounter for annual routine gynecological examination  Cervical cancer screening - Plan: [facility] PAP  Screening for STD (sexually transmitted disease) - Plan: [facility] P[facility]  Encounter for routine checking of intrauterine contraceptive device (IUD); IUD in place.   Breakthrough bleeding with IUD--Neg home UPT. Check STDs. If neg and sx resolved, reassurance. If sx recur, will check GYN u/s. Pt to f/u prn.            GYN counsel adequate intake of calcium and vitamin D, diet and exercise, Gardasil discussed with handout  given     F/U  No follow-ups on file.  Jerron Niblack B. Keirstyn Aydt, PA-C 09/25/2020 2:29 PM

## 2020-09-26 ENCOUNTER — Ambulatory Visit: Payer: BC Managed Care – PPO | Admitting: Obstetrics and Gynecology

## 2020-09-26 DIAGNOSIS — Z113 Encounter for screening for infections with a predominantly sexual mode of transmission: Secondary | ICD-10-CM

## 2020-09-26 DIAGNOSIS — Z30431 Encounter for routine checking of intrauterine contraceptive device: Secondary | ICD-10-CM

## 2020-09-26 DIAGNOSIS — Z01419 Encounter for gynecological examination (general) (routine) without abnormal findings: Secondary | ICD-10-CM

## 2020-10-02 NOTE — Progress Notes (Signed)
PCP:  Patient, No Pcp Per (Inactive)   Chief Complaint  Patient presents with   Gynecologic Exam     HPI:      [name] is a 25 y.o. G0P0000 who  Patient's last menstrual period was 09/07/2020 (approximate)., presents today for her annual examination.  Her menses are regular every 28-30 days, lasting 4-5 days, light flow, with IUD.  Dysmenorrhea none. She does not have BTB.  Sex activity: single partner, contraception - IUD. Kyleena placed 02/2017. Pt states strings were cut very short; had GYN u/s in past to confirm placement. Last Pap: 05/14/19 Results were normal Hx of STDs: none  There is no FH of breast cancer. There is no FH of ovarian cancer. The patient does not do self-breast exams.  Tobacco use: The patient denies current or previous tobacco use. Alcohol use: none No drug use.  Exercise: moderately active  She does get adequate calcium but not Vitamin D in her diet. Has issues with easy bruising. Would like labs checked. Not taking MVI. Gardasil not done.  There are no problems to display for this patient.   Past Surgical History:  Procedure Laterality Date   NO PAST SURGERIES      Family History  Problem Relation Age of Onset   Healthy Mother    Healthy Father    Breast cancer Neg Hx    Ovarian cancer Neg Hx     Social History   Socioeconomic History   Marital status: Single    Spouse name: Not on file   Number of children: Not on file   Years of education: Not on file   Highest education level: Not on file  Occupational History   Not on file  Tobacco Use   Smoking status: Never   Smokeless tobacco: Never  Vaping Use   Vaping Use: Never used  Substance and Sexual Activity   Alcohol use: Never   Drug use: Never   Sexual activity: Yes    Birth control/protection: I.U.D.    Comment: Kyleena  Other Topics Concern   Not on file  Social History Narrative   ** Merged History Encounter **       Social Determinants of Health    Financial Resource Strain: Not on file  Food Insecurity: Not on file  Transportation Needs: Not on file  Physical Activity: Not on file  Stress: Not on file  Social Connections: Not on file  Intimate Partner Violence: Not on file     Current Outpatient Medications:    Levonorgestrel (KYLEENA) 19.5 MG IUD, by Intrauterine route., Disp: , Rfl:      ROS:  Review of Systems  Constitutional:  Negative for fatigue, fever and unexpected weight change.  Respiratory:  Negative for cough, shortness of breath and wheezing.   Cardiovascular:  Negative for chest pain, palpitations and leg swelling.  Gastrointestinal:  Negative for blood in stool, constipation, diarrhea, nausea and vomiting.  Endocrine: Negative for cold intolerance, heat intolerance and polyuria.  Genitourinary:  Negative for dyspareunia, dysuria, flank pain, frequency, genital sores, hematuria, menstrual problem, pelvic pain, urgency, vaginal bleeding, vaginal discharge and vaginal pain.  Musculoskeletal:  Negative for back pain, joint swelling and myalgias.  Skin:  Negative for rash.  Neurological:  Negative for dizziness, syncope, light-headedness, numbness and headaches.  Hematological:  Negative for adenopathy.  Psychiatric/Behavioral:  Negative for agitation, confusion, sleep disturbance and suicidal ideas. The patient is not nervous/anxious.  BREAST: No symptoms   Objective: BP 110/80  Ht 5\' 3"  (1.6 m)   Wt 126 lb (57.2 kg)   LMP 09/07/2020 (Approximate)   BMI 22.32 kg/m    Physical Exam Constitutional:      Appearance: She is well-developed.  Genitourinary:     Vulva normal.     Right Labia: No rash, tenderness or lesions.    Left Labia: No tenderness, lesions or rash.    No vaginal discharge, erythema or tenderness.      Right Adnexa: not tender and no mass present.    Left Adnexa: not tender and no mass present.    No cervical friability or polyp.     No IUD strings visualized.     Uterus is  not enlarged or tender.  Breasts:    Right: No mass, nipple discharge, skin change or tenderness.     Left: No mass, nipple discharge, skin change or tenderness.  Neck:     Thyroid: No thyromegaly.  Cardiovascular:     Rate and Rhythm: Normal rate and regular rhythm.     Heart sounds: Normal heart sounds. No murmur heard. Pulmonary:     Effort: Pulmonary effort is normal.     Breath sounds: Normal breath sounds.  Abdominal:     Palpations: Abdomen is soft.     Tenderness: There is no abdominal tenderness. There is no guarding or rebound.  Musculoskeletal:        General: Normal range of motion.     Cervical back: Normal range of motion.  Lymphadenopathy:     Cervical: No cervical adenopathy.  Neurological:     General: No focal deficit present.     Mental Status: She is alert and oriented to person, place, and time.     Cranial Nerves: No cranial nerve deficit.  Skin:    General: Skin is warm and dry.  Psychiatric:        Mood and Affect: Mood normal.        Behavior: Behavior normal.        Thought Content: Thought content normal.        Judgment: Judgment normal.  Vitals reviewed.    Assessment/Plan:  Encounter for annual routine gynecological examination  Screening for STD (sexually transmitted disease) - Plan: Cervicovaginal ancillary only  Encounter for routine checking of intrauterine contraceptive device (IUD)--IUD strings not seen or felt, but cut short. IUD placement confirmed in past. F/u prn. Due for removal 11/23.  Bruises easily - Plan: Comprehensive metabolic panel, CBC with Differential/Platelet; check labs. Add MVI. Reassurance if WNL.   Blood tests for routine general physical examination - Plan: Comprehensive metabolic panel, CBC with Differential/Platelet           GYN counsel adequate intake of calcium and vitamin D, diet and exercise.    F/U  Return in about 1 year (around 10/03/2021).  Leibish Mcgregor B. Creed Kail, PA-C 10/03/2020 11:41 AM

## 2020-10-03 ENCOUNTER — Other Ambulatory Visit: Payer: Self-pay

## 2020-10-03 ENCOUNTER — Encounter: Payer: Self-pay | Admitting: Obstetrics and Gynecology

## 2020-10-03 ENCOUNTER — Ambulatory Visit (INDEPENDENT_AMBULATORY_CARE_PROVIDER_SITE_OTHER): Payer: BC Managed Care – PPO | Admitting: Obstetrics and Gynecology

## 2020-10-03 ENCOUNTER — Other Ambulatory Visit (HOSPITAL_COMMUNITY)
Admission: RE | Admit: 2020-10-03 | Discharge: 2020-10-03 | Disposition: A | Discharge status: Home / Self Care | Payer: BC Managed Care – PPO | Source: Ambulatory Visit | Attending: Obstetrics and Gynecology | Admitting: Obstetrics and Gynecology

## 2020-10-03 VITALS — BP 110/80 | Ht 63.0 in | Wt 126.0 lb

## 2020-10-03 DIAGNOSIS — Z113 Encounter for screening for infections with a predominantly sexual mode of transmission: Secondary | ICD-10-CM

## 2020-10-03 DIAGNOSIS — Z01419 Encounter for gynecological examination (general) (routine) without abnormal findings: Secondary | ICD-10-CM | POA: Diagnosis not present

## 2020-10-03 DIAGNOSIS — R238 Other skin changes: Secondary | ICD-10-CM | POA: Diagnosis not present

## 2020-10-03 DIAGNOSIS — Z0000 Encounter for general adult medical examination without abnormal findings: Secondary | ICD-10-CM

## 2020-10-03 DIAGNOSIS — Z30431 Encounter for routine checking of intrauterine contraceptive device: Secondary | ICD-10-CM | POA: Diagnosis not present

## 2020-10-03 DIAGNOSIS — R233 Spontaneous ecchymoses: Secondary | ICD-10-CM

## 2020-10-04 LAB — COMPREHENSIVE METABOLIC PANEL
ALT: 15 IU/L (ref 0–32)
AST: 18 IU/L (ref 0–40)
Albumin/Globulin Ratio: 1.8 (ref 1.2–2.2)
Albumin: 4.6 g/dL (ref 3.9–5.0)
Alkaline Phosphatase: 72 IU/L (ref 44–121)
BUN/Creatinine Ratio: 6 — ABNORMAL LOW (ref 9–23)
BUN: 4 mg/dL — ABNORMAL LOW (ref 6–20)
Bilirubin Total: 0.7 mg/dL (ref 0.0–1.2)
CO2: 23 mmol/L (ref 20–29)
Calcium: 9.4 mg/dL (ref 8.7–10.2)
Chloride: 105 mmol/L (ref 96–106)
Creatinine, Ser: 0.64 mg/dL (ref 0.57–1.00)
Globulin, Total: 2.6 g/dL (ref 1.5–4.5)
Glucose: 92 mg/dL (ref 65–99)
Potassium: 4.3 mmol/L (ref 3.5–5.2)
Sodium: 140 mmol/L (ref 134–144)
Total Protein: 7.2 g/dL (ref 6.0–8.5)
eGFR: 128 mL/min/{1.73_m2} (ref >59)

## 2020-10-04 LAB — CBC WITH DIFFERENTIAL/PLATELET
Basophils Absolute: 0 10*3/uL (ref 0.0–0.2)
Basos: 1 %
EOS (ABSOLUTE): 0.1 10*3/uL (ref 0.0–0.4)
Eos: 2 %
Hematocrit: 38.8 % (ref 34.0–46.6)
Hemoglobin: 13.2 g/dL (ref 11.1–15.9)
Immature Grans (Abs): 0 10*3/uL (ref 0.0–0.1)
Immature Granulocytes: 0 %
Lymphocytes Absolute: 1.4 10*3/uL (ref 0.7–3.1)
Lymphs: 22 %
MCH: 29.3 pg (ref 26.6–33.0)
MCHC: 34 g/dL (ref 31.5–35.7)
MCV: 86 fL (ref 79–97)
Monocytes Absolute: 0.4 10*3/uL (ref 0.1–0.9)
Monocytes: 6 %
Neutrophils Absolute: 4.5 10*3/uL (ref 1.4–7.0)
Neutrophils: 69 %
Platelets: 242 10*3/uL (ref 150–450)
RBC: 4.5 x10E6/uL (ref 3.77–5.28)
RDW: 11.9 % (ref 11.7–15.4)
WBC: 6.4 10*3/uL (ref 3.4–10.8)

## 2020-10-05 LAB — CERVICOVAGINAL ANCILLARY ONLY
Chlamydia: NEGATIVE
Comment: NEGATIVE
Comment: NORMAL
Neisseria Gonorrhea: NEGATIVE

## 2020-10-09 IMAGING — CR RIGHT FOOT COMPLETE - 3+ VIEW
3 series · 3 of 3 positions shown · non-contrast
Comparison: None.

CLINICAL DATA: 22-year-old female with acute RIGHT foot pain for 1
week following running. Initial encounter.

EXAM:
RIGHT FOOT COMPLETE - 3+ VIEW

[foot ap]
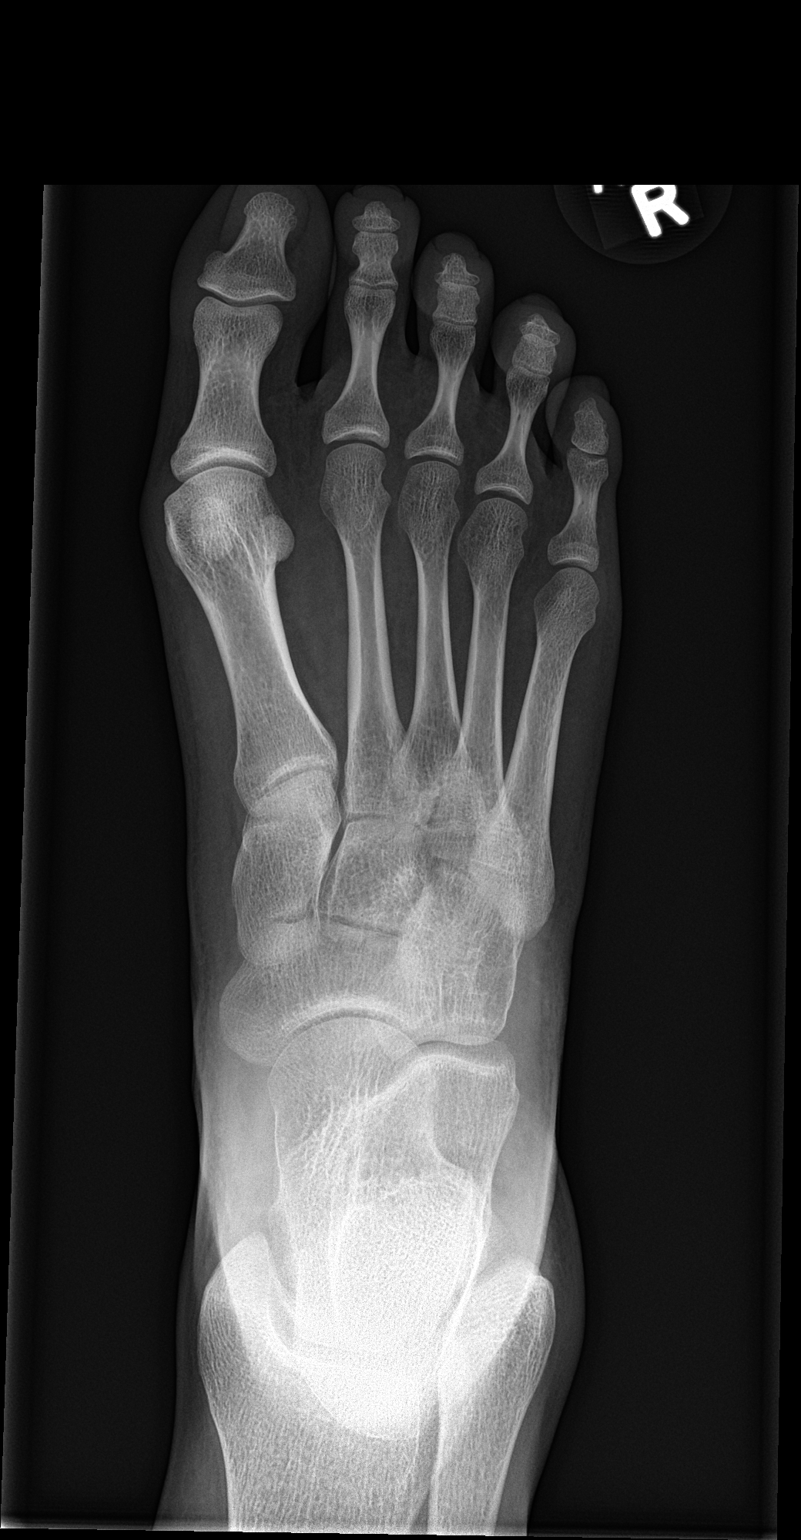

[foot obl]
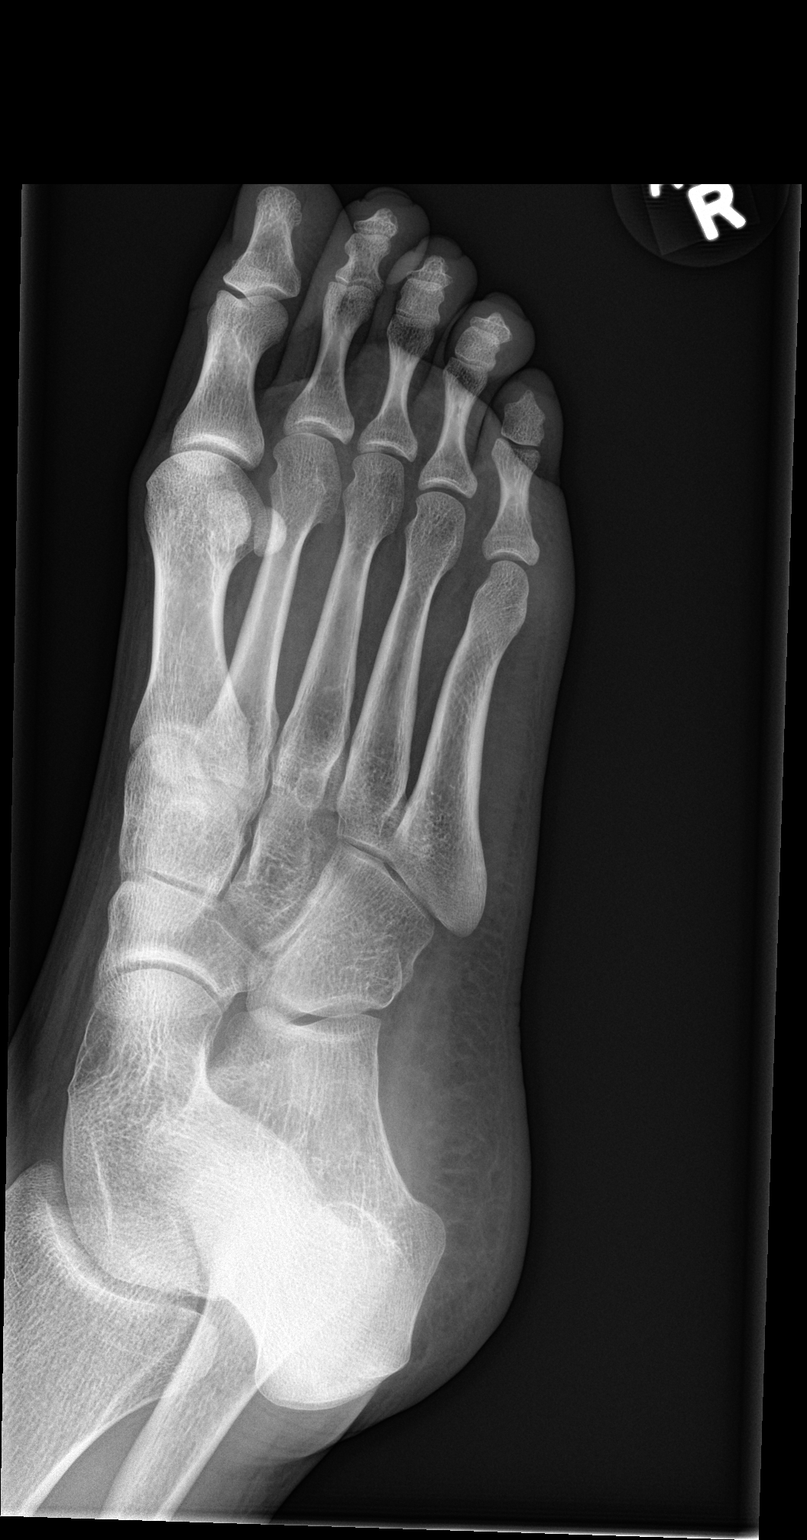

[foot lat]
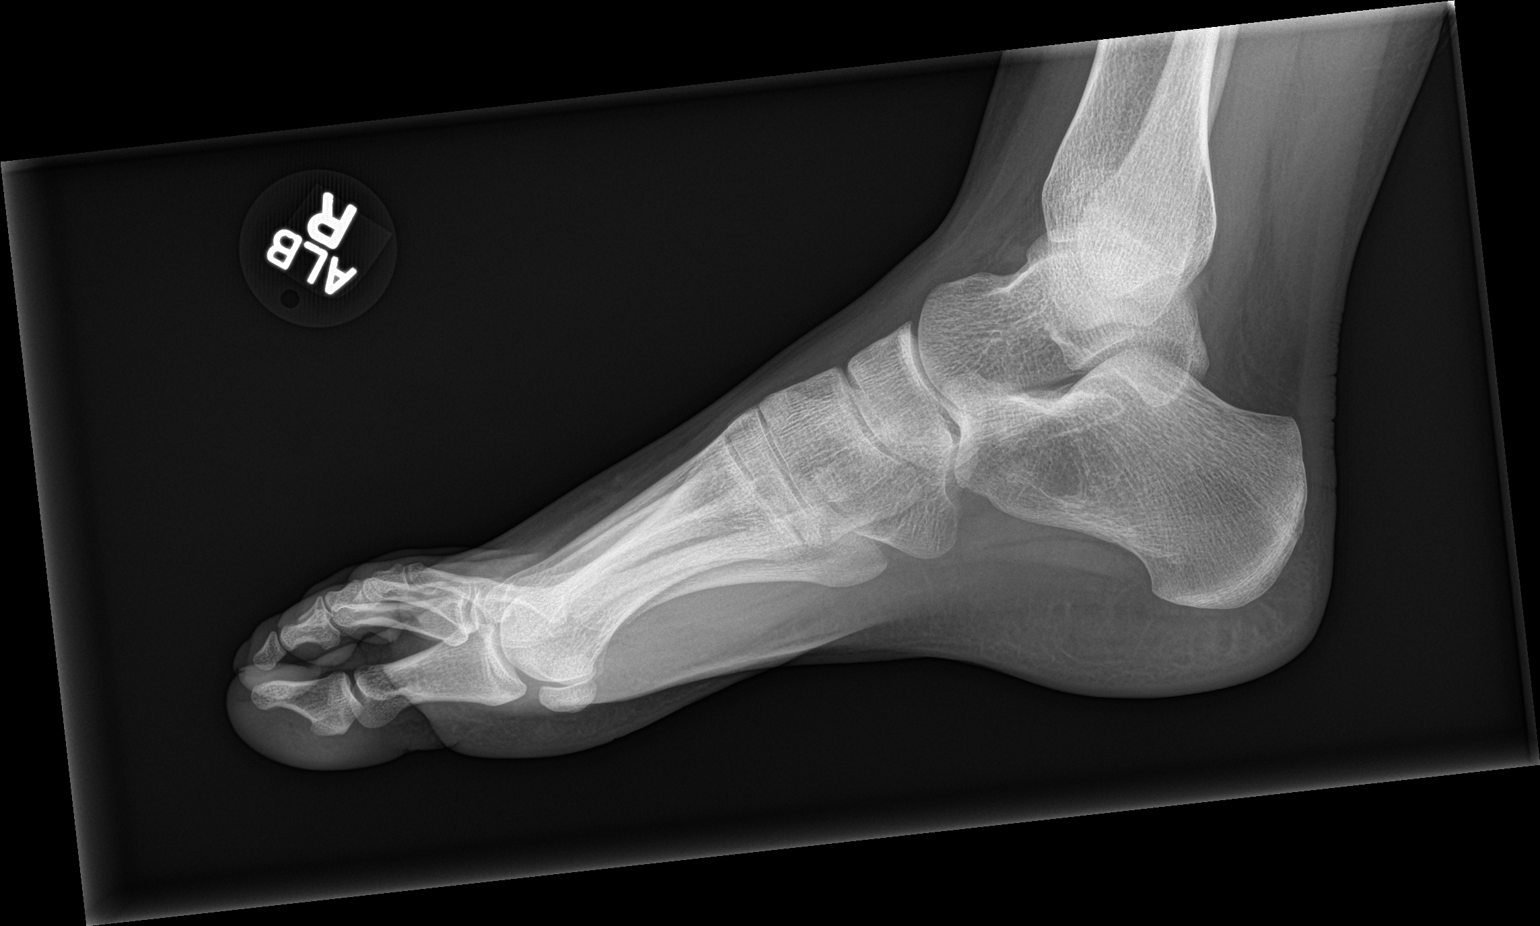

[3 of 3 positions shown; findings below may reference images not displayed]

FINDINGS: There is no evidence of fracture or dislocation. There is no
evidence of arthropathy or other focal bone abnormality. Soft
tissues are unremarkable.
IMPRESSION: Negative.

## 2020-10-26 ENCOUNTER — Encounter: Payer: Self-pay | Admitting: Gynecology

## 2020-10-26 ENCOUNTER — Other Ambulatory Visit: Payer: Self-pay

## 2020-10-26 ENCOUNTER — Ambulatory Visit
Admission: EM | Admit: 2020-10-26 | Discharge: 2020-10-26 | Disposition: A | Discharge status: Home / Self Care | Payer: BC Managed Care – PPO | Attending: Emergency Medicine | Admitting: Emergency Medicine

## 2020-10-26 DIAGNOSIS — J069 Acute upper respiratory infection, unspecified: Secondary | ICD-10-CM | POA: Diagnosis not present

## 2020-10-26 LAB — POCT RAPID STREP A: Streptococcus, Group A Screen (Direct): NEGATIVE

## 2020-10-26 MED ORDER — PROMETHAZINE-DM 6.25-15 MG/5ML PO SYRP: 5.0000 mL | ORAL_SOLUTION | Freq: Four times a day (QID) | ORAL | 0 refills | Status: DC | PRN

## 2020-10-26 MED ORDER — IPRATROPIUM BROMIDE 0.06 % NA SOLN: 2.0000 | Freq: Four times a day (QID) | NASAL | 12 refills | Status: AC

## 2020-10-26 MED ORDER — BENZONATATE 100 MG PO CAPS: 200.0000 mg | ORAL_CAPSULE | Freq: Three times a day (TID) | ORAL | 0 refills | Status: DC

## 2020-10-26 NOTE — ED Triage Notes (Signed)
Patient c/o dry cough x over 1 week. Per patient with throat irritation.

## 2020-10-26 NOTE — ED Provider Notes (Signed)
[facility]    CSN: [id number] Arrival date & time: 10/26/20  1059      History   Chief Complaint Chief Complaint  Patient presents with   Cough    HPI Debbie Booker is a 25 y.o. female.   HPI  25 year old female here for evaluation of dry cough.  Patient reports that she has been experiencing a nonproductive cough for the last week.  She states she is now started develop a sore throat as result.  She also endorses some nasal congestion and ear pressure.  She denies fever, runny nose, shortness breath or wheezing, or GI complaints.  Patient ports that something similar this a few months ago and was treated with Jerilynn Som which really did not help her.  Past Medical History:  Diagnosis Date   No pertinent past medical history     There are no problems to display for this patient.   Past Surgical History:  Procedure Laterality Date   NO PAST SURGERIES      OB History     Gravida  0   Para  0   Term  0   Preterm  0   AB  0   Living  0      SAB  0   IAB  0   Ectopic  0   Multiple  0   Live Births  0            Home Medications    Prior to Admission medications   Medication Sig Start Date End Date Taking? Authorizing Provider  benzonatate (TESSALON) 100 MG capsule Take 2 capsules (200 mg total) by mouth every 8 (eight) hours. 10/26/20  Yes Becky Augusta, NP  ipratropium (ATROVENT) 0.06 % nasal spray Place 2 sprays into both nostrils 4 (four) times daily. 10/26/20  Yes Becky Augusta, NP  Levonorgestrel [facility]) 19.5 MG IUD by Intrauterine route. 03/01/16  Yes [provider]  promethazine-dextromethorphan (PROMETHAZINE-DM) 6.25-15 MG/5ML syrup Take 5 mLs by mouth 4 (four) times daily as needed. 10/26/20  Yes Becky Augusta, NP    Family History Family History  Problem Relation Age of Onset   Healthy Mother    Healthy Father    Breast cancer Neg Hx    Ovarian cancer Neg Hx     Social History Social History    Tobacco Use   Smoking status: Never   Smokeless tobacco: Never  Vaping Use   Vaping Use: Never used  Substance Use Topics   Alcohol use: Never   Drug use: Never     Allergies   Patient has no known allergies.   Review of Systems Review of Systems  Constitutional:  Negative for fever.  HENT:  Positive for congestion and sore throat. Negative for postnasal drip and rhinorrhea.   Respiratory:  Positive for cough. Negative for shortness of breath and wheezing.   Gastrointestinal:  Negative for diarrhea, nausea and vomiting.  Skin:  Negative for rash.  Hematological: Negative.   Psychiatric/Behavioral: Negative.      Physical Exam Triage Vital Signs ED Triage Vitals  Enc Vitals Group     BP 10/26/20 1155 128/82     Pulse Rate 10/26/20 1155 72     Resp 10/26/20 1155 16     Temp 10/26/20 1155 98.3 F (36.8 C)     Temp Source 10/26/20 1155 Oral     SpO2 10/26/20 1155 100 %     Weight 10/26/20 1156 127 lb (57.6 kg)  Height 10/26/20 1156 5\' 3"  (1.6 m)     Head Circumference --      Peak Flow --      Pain Score 10/26/20 1155 2     Pain Loc --      Pain Edu? --      Excl. in GC? --    No data found.  Updated Vital Signs BP 128/82 (BP Location: Left Arm)   Pulse 72   Temp 98.3 F (36.8 C) (Oral)   Resp 16   Ht 5\' 3"  (1.6 m)   Wt 127 lb (57.6 kg)   LMP 10/13/2020   SpO2 100%   BMI 22.50 kg/m   Visual Acuity Right Eye Distance:   Left Eye Distance:   Bilateral Distance:    Right Eye Near:   Left Eye Near:    Bilateral Near:     Physical Exam Vitals and nursing note reviewed.  Constitutional:      General: She is not in acute distress.    Appearance: Normal appearance. She is normal weight. She is not ill-appearing.  HENT:     Head: Normocephalic and atraumatic.     Right Ear: Tympanic membrane, ear canal and external ear normal. There is no impacted cerumen.     Left Ear: Tympanic membrane, ear canal and external ear normal. There is no impacted  cerumen.     Nose: Congestion and rhinorrhea present.     Mouth/Throat:     Mouth: Mucous membranes are moist.     Pharynx: Oropharynx is clear. Posterior oropharyngeal erythema present.  Cardiovascular:     Rate and Rhythm: Normal rate and regular rhythm.     Pulses: Normal pulses.     Heart sounds: Normal heart sounds. No murmur heard.   No gallop.  Pulmonary:     Effort: Pulmonary effort is normal.     Breath sounds: Normal breath sounds. No wheezing, rhonchi or rales.  Musculoskeletal:     Cervical back: Normal range of motion and neck supple.  Lymphadenopathy:     Cervical: No cervical adenopathy.  Skin:    General: Skin is warm and dry.     Capillary Refill: Capillary refill takes less than 2 seconds.     Findings: No erythema or rash.  Neurological:     General: No focal deficit present.     Mental Status: She is alert and oriented to person, place, and time.  Psychiatric:        Mood and Affect: Mood normal.        Behavior: Behavior normal.        Thought Content: Thought content normal.        Judgment: Judgment normal.     UC Treatments / Results  Labs (all labs ordered are listed, but only abnormal results are displayed) Labs Reviewed  CULTURE, GROUP A STREP [facility])  POCT RAPID STREP A, ED / UC  POCT RAPID STREP A    EKG   Radiology No results found.  Procedures Procedures (including critical care time)  Medications Ordered in UC Medications - No data to display  Initial Impression / Assessment and Plan / UC Course  I have reviewed the triage vital signs and the nursing notes.  Pertinent labs & imaging results that were available during my care of the patient were reviewed by me and considered in my medical decision making (see chart for details).  Is a very pleasant and nontoxic-appearing 25 year old female here for evaluation of nonproductive cough  as described in HPI above.  Patient's physical exam reveals pearly gray tympanic membranes  bilaterally with normal light reflex and clear external auditory canals.  Nasal mucosa is erythematous and edematous with scant clear nasal discharge.  Oropharyngeal exam reveals posterior oropharyngeal erythema and cobblestoning with clear postnasal drip.  No cervical lymphadenopathy appreciated exam.  Cardiopulmonary exam is benign.  Patient's exam is consistent with a viral URI with cough.  We will treat with Atrovent nasal spray, Tessalon Perles, and Promethazine DM cough syrup.   Final Clinical Impressions(s) / UC Diagnoses   Final diagnoses:  Viral URI with cough     Discharge Instructions      Use the Atrovent nasal spray, 2 squirts in each nostril every 6 hours, as needed for runny nose and postnasal drip.  Use the Tessalon Perles every 8 hours during the day.  Take them with a small sip of water.  They may give you some numbness to the base of your tongue or a metallic taste in your mouth, this is normal.  Use the Promethazine DM cough syrup at bedtime for cough and congestion.  It will make you drowsy so do not take it during the day.  Return for reevaluation or see your primary care provider for any new or worsening symptoms.      ED Prescriptions     Medication Sig Dispense Auth. Provider   benzonatate (TESSALON) 100 MG capsule Take 2 capsules (200 mg total) by mouth every 8 (eight) hours. 21 capsule Becky Augusta, NP   ipratropium (ATROVENT) 0.06 % nasal spray Place 2 sprays into both nostrils 4 (four) times daily. 15 mL Becky Augusta, NP   promethazine-dextromethorphan (PROMETHAZINE-DM) 6.25-15 MG/5ML syrup Take 5 mLs by mouth 4 (four) times daily as needed. 118 mL Becky Augusta, NP      PDMP not reviewed this encounter.   Becky Augusta, NP 10/26/20 1304

## 2020-10-26 NOTE — Discharge Instructions (Addendum)

## 2020-10-29 LAB — CULTURE, GROUP A STREP (THRC)

## 2020-12-22 ENCOUNTER — Ambulatory Visit (INDEPENDENT_AMBULATORY_CARE_PROVIDER_SITE_OTHER): Payer: BC Managed Care – PPO | Admitting: Obstetrics and Gynecology

## 2020-12-22 ENCOUNTER — Encounter: Payer: Self-pay | Admitting: Obstetrics and Gynecology

## 2020-12-22 ENCOUNTER — Other Ambulatory Visit: Payer: Self-pay

## 2020-12-22 VITALS — BP 110/80 | Ht 63.0 in | Wt 131.0 lb

## 2020-12-22 DIAGNOSIS — Z30433 Encounter for removal and reinsertion of intrauterine contraceptive device: Secondary | ICD-10-CM

## 2020-12-22 MED ORDER — KYLEENA 19.5 MG IU IUD: 1.0000 | INTRAUTERINE_SYSTEM | Freq: Once | INTRAUTERINE | 0 refills | Status: DC

## 2020-12-22 NOTE — Patient Instructions (Signed)
I value your feedback and you entrusting us with your care. If you get a [facility] patient survey, I would appreciate you taking the time to let us know about your experience today. Thank you!  [facility] [phone]  Instructions after IUD insertion  Most women experience no significant problems after insertion of an IUD, however minor cramping and spotting for a few days is common. Cramps may be treated with ibuprofen 800mg every 8 hours or Tylenol 650 mg every 4 hours. Contact [facility] immediately if you experience any of the following symptoms during the next week: temperature >99.6 degrees, worsening pelvic pain, abdominal pain, fainting, unusually heavy vaginal bleeding, foul vaginal discharge, or if you think you have expelled the IUD.  Nothing inserted in the vagina for 48 hours. You will be scheduled for a follow up visit in approximately four weeks.  You should check monthly to be sure you can feel the IUD strings in the upper vagina. If you are having a monthly period, try to check after each period. If you cannot feel the IUD strings,  contact [facility] immediately so we can do an exam to determine if the IUD has been expelled.   Please use backup protection until we can confirm the IUD is in place.  Call [facility] if you are exposed to or diagnosed with a sexually transmitted infection, as we will need to discuss whether it is safe for you to continue using an IUD.   

## 2020-12-22 NOTE — Progress Notes (Signed)
   Chief Complaint  Patient presents with   IUD removal    Not sure if wants new BC     History of Present Illness:  Debbie Booker is a 25 y.o. that had a  [place]  IUD placed almost 5 years ago (11/17; thought it was 11/18 but pt found her card). Since that time, she denies dyspareunia, pelvic pain, non-menstrual bleeding, vaginal d/c, heavy bleeding. Menses are monthly, lasting a few days, improved with IUD. She was unsure if she wanted another one when she came in but then decided on the replacement. Annual done 6/22  BP 110/80   Ht 5\' 3"  (1.6 m)   Wt 131 lb (59.4 kg)   BMI 23.21 kg/m    IUD Removal Strings of IUD identified and grasped.  IUD removed without problem with Boseman forceps.  Pt tolerated this well.  IUD noted to be intact.  IUD Insertion Procedure Note Patient identified, informed consent performed, consent signed.   Discussed risks of irregular bleeding, cramping, infection, malpositioning or misplacement of the IUD outside the uterus which may require further procedure such as laparoscopy, risk of failure <1%. Time out was performed.    Speculum placed in the vagina.  Cervix visualized.  Cleaned with Betadine x 2.  Grasped anteriorly with a single tooth tenaculum.  Uterus sounded to 6.0 cm.   IUD placed per manufacturer's recommendations.  Strings trimmed to 3 cm. Tenaculum was removed, good hemostasis noted.  Patient tolerated procedure well.   ASSESSMENT:  Encounter for removal and reinsertion of intrauterine contraceptive device (IUD) - Plan: levonorgestrel (KYLEENA) 19.5 MG IUD   Meds ordered this encounter  Medications   levonorgestrel (KYLEENA) 19.5 MG IUD    Sig: 1 each by Intrauterine route once for 1 dose.    Dispense:  1 each    Refill:  0    Order Specific Question:   Supervising Provider    Answer:   Nadara Mustard      Plan:  Patient was given post-procedure instructions.  She was advised to have backup contraception for one  week.   Call if you are having increasing pain, cramps or bleeding or if you have a fever greater than 100.4 degrees F., shaking chills, nausea or vomiting. Patient was also asked to check IUD strings periodically and follow up in 4 weeks for IUD check.  Return in about 4 weeks (around 01/19/2021) for IUD f/u.  Darnisha Vernet B. Cayleb Jarnigan, PA-C 12/22/2020 3:53 PM

## 2021-01-19 ENCOUNTER — Other Ambulatory Visit: Payer: Self-pay

## 2021-01-19 ENCOUNTER — Ambulatory Visit (INDEPENDENT_AMBULATORY_CARE_PROVIDER_SITE_OTHER): Payer: BC Managed Care – PPO | Admitting: Obstetrics and Gynecology

## 2021-01-19 ENCOUNTER — Encounter: Payer: Self-pay | Admitting: Obstetrics and Gynecology

## 2021-01-19 VITALS — BP 90/70 | Ht 63.0 in | Wt 132.0 lb

## 2021-01-19 DIAGNOSIS — Z30431 Encounter for routine checking of intrauterine contraceptive device: Secondary | ICD-10-CM | POA: Diagnosis not present

## 2021-01-19 NOTE — Progress Notes (Signed)
Patient, No Pcp Per (Inactive)   Chief Complaint  Patient presents with   IUD check    HPI:      [name] is a 25 y.o. G0P0000 whose LMP was No LMP recorded. (Menstrual status: IUD)., presents today for IUD f/u. Kyleena REplaced a month ago. Doing well. Had a 7 day period, mod flow, mild dysmen shortly after replacement, but no bleeding/dysmen since. Doing well. No dyspareunia.    Past Medical History:  Diagnosis Date   No pertinent past medical history     Past Surgical History:  Procedure Laterality Date   NO PAST SURGERIES      Family History  Problem Relation Age of Onset   Healthy Mother    Healthy Father    Breast cancer Neg Hx    Ovarian cancer Neg Hx     Social History   Socioeconomic History   Marital status: Single    Spouse name: Not on file   Number of children: Not on file   Years of education: Not on file   Highest education level: Not on file  Occupational History   Not on file  Tobacco Use   Smoking status: Never   Smokeless tobacco: Never  Vaping Use   Vaping Use: Never used  Substance and Sexual Activity   Alcohol use: Never   Drug use: Never   Sexual activity: Yes    Birth control/protection: I.U.D.    Comment: Kyleena  Other Topics Concern   Not on file  Social History Narrative   ** Merged History Encounter **       Social Determinants of Health   Financial Resource Strain: Not on file  Food Insecurity: Not on file  Transportation Needs: Not on file  Physical Activity: Not on file  Stress: Not on file  Social Connections: Not on file  Intimate Partner Violence: Not on file    Outpatient Medications Prior to Visit  Medication Sig Dispense Refill   ipratropium (ATROVENT) 0.06 % nasal spray Place 2 sprays into both nostrils 4 (four) times daily. 15 mL 12   levonorgestrel (KYLEENA) 19.5 MG IUD 1 each by Intrauterine route once for 1 dose. 1 each 0   No facility-administered medications prior to visit.       ROS:  Review of Systems  Constitutional:  Negative for fever.  Gastrointestinal:  Negative for blood in stool, constipation, diarrhea, nausea and vomiting.  Genitourinary:  Negative for dyspareunia, dysuria, flank pain, frequency, hematuria, urgency, vaginal bleeding, vaginal discharge and vaginal pain.  Musculoskeletal:  Negative for back pain.  Skin:  Negative for rash.  BREAST: No symptoms   OBJECTIVE:   Vitals:  BP 90/70   Ht 5\' 3"  (1.6 m)   Wt 132 lb (59.9 kg)   BMI 23.38 kg/m   Physical Exam Vitals reviewed.  Constitutional:      Appearance: She is well-developed.  Pulmonary:     Effort: Pulmonary effort is normal.  Genitourinary:    General: Normal vulva.     Pubic Area: No rash.      Labia:        Right: No rash, tenderness or lesion.        Left: No rash, tenderness or lesion.      Vagina: Normal. No vaginal discharge, erythema or tenderness.     Cervix: Normal.     Uterus: Normal. Not enlarged and not tender.      Adnexa: Right adnexa normal and left  adnexa normal.       Right: No mass or tenderness.         Left: No mass or tenderness.       Comments: IUD STRINGS IN CX OS Musculoskeletal:        General: Normal range of motion.     Cervical back: Normal range of motion.  Skin:    General: Skin is warm and dry.  Neurological:     General: No focal deficit present.     Mental Status: She is alert and oriented to person, place, and time.  Psychiatric:        Mood and Affect: Mood normal.        Behavior: Behavior normal.        Thought Content: Thought content normal.        Judgment: Judgment normal.    Assessment/Plan: Encounter for routine checking of intrauterine contraceptive device (IUD)--doing well. F/u prn.     Return if symptoms worsen or fail to improve.  Eboney Claybrook B. Florina Glas, PA-C 01/19/2021 2:38 PM

## 2021-02-06 ENCOUNTER — Ambulatory Visit: Payer: BC Managed Care – PPO | Admitting: Obstetrics and Gynecology

## 2021-09-01 ENCOUNTER — Ambulatory Visit: Payer: BC Managed Care – PPO | Admitting: Obstetrics and Gynecology

## 2021-09-04 ENCOUNTER — Other Ambulatory Visit (HOSPITAL_COMMUNITY)
Admission: RE | Admit: 2021-09-04 | Discharge: 2021-09-04 | Disposition: A | Discharge status: Home / Self Care | Payer: BC Managed Care – PPO | Source: Ambulatory Visit | Attending: Obstetrics and Gynecology | Admitting: Obstetrics and Gynecology

## 2021-09-04 ENCOUNTER — Ambulatory Visit (INDEPENDENT_AMBULATORY_CARE_PROVIDER_SITE_OTHER): Payer: BC Managed Care – PPO | Admitting: Licensed Practical Nurse

## 2021-09-04 VITALS — BP 120/72 | Ht 63.0 in | Wt 135.0 lb

## 2021-09-04 DIAGNOSIS — Z113 Encounter for screening for infections with a predominantly sexual mode of transmission: Secondary | ICD-10-CM

## 2021-09-04 DIAGNOSIS — N920 Excessive and frequent menstruation with regular cycle: Secondary | ICD-10-CM | POA: Diagnosis not present

## 2021-09-04 DIAGNOSIS — Z308 Encounter for other contraceptive management: Secondary | ICD-10-CM | POA: Diagnosis not present

## 2021-09-04 DIAGNOSIS — Z30432 Encounter for removal of intrauterine contraceptive device: Secondary | ICD-10-CM

## 2021-09-04 MED ORDER — ETONOGESTREL-ETHINYL ESTRADIOL 0.12-0.015 MG/24HR VA RING: VAGINAL_RING | VAGINAL | 12 refills | Status: DC

## 2021-09-04 NOTE — Progress Notes (Signed)
? ? ? ?Obstetrics & Gynecology Office Visit  ? ?Chief Complaint:  ?Chief Complaint  ?Patient presents with  ? Contraception  ? ? ?History of Present Illness: Had IUD removed and reinserted 11/2020.  Over the last few months she has had cycles that last 2 weeks and are heavy-she needs to change a tampon every 2-3 hours,  She will also bleed for one week after IC. With her first IUD she did not experience this type of bleeding, she had regular light cycles then.  She desires the IUD to be removed today and to try something else.  She does not want to use  Depo as she is concerned for potential weight gain. Unsure about Nexplanon.  Would like to try the ring as her sister uses this and likes it.  Denies hx CHTN or migraines, is a non-smoker. Is sexually active with one female, they have been together for 10 years. She does not desire a pregnancy.  ? ? ?Review of Systems: nausea  ? ?Past Medical History:  ?Past Medical History:  ?Diagnosis Date  ? No pertinent past medical history   ? ? ?Past Surgical History:  ?Past Surgical History:  ?Procedure Laterality Date  ? NO PAST SURGERIES    ? ? ?Gynecologic History: No LMP recorded. (Menstrual status: IUD). ? ?Obstetric History: G0P0000 ? ?Family History:  ?Family History  ?Problem Relation Age of Onset  ? Healthy Mother   ? Healthy Father   ? Breast cancer Neg Hx   ? Ovarian cancer Neg Hx   ? ? ?Social History:  ?Social History  ? ?Socioeconomic History  ? Marital status: Single  ?  Spouse name: Not on file  ? Number of children: Not on file  ? Years of education: Not on file  ? Highest education level: Not on file  ?Occupational History  ? Not on file  ?Tobacco Use  ? Smoking status: Never  ? Smokeless tobacco: Never  ?Vaping Use  ? Vaping Use: Never used  ?Substance and Sexual Activity  ? Alcohol use: Never  ? Drug use: Never  ? Sexual activity: Yes  ?  Birth control/protection: I.U.D.  ?  Comment: Kyleena  ?Other Topics Concern  ? Not on file  ?Social History Narrative  ?  ** Merged History Encounter **  ?    ? ?Social Determinants of Health  ? ?Financial Resource Strain: Not on file  ?Food Insecurity: Not on file  ?Transportation Needs: Not on file  ?Physical Activity: Not on file  ?Stress: Not on file  ?Social Connections: Not on file  ?Intimate Partner Violence: Not on file  ? ? ?Allergies:  ?No Known Allergies ? ?Medications: ?Prior to Admission medications   ?Medication Sig Start Date End Date Taking? Authorizing Provider  ?etonogestrel-ethinyl estradiol (NUVARING) 0.12-0.015 MG/24HR vaginal ring Insert vaginally and leave in place for 3 consecutive weeks, then remove for 1 week. 09/04/21  Yes Jermall Isaacson, Nunzio Cobbs, CNM  ?ipratropium (ATROVENT) 0.06 % nasal spray Place 2 sprays into both nostrils 4 (four) times daily. 10/26/20  Yes Margarette Canada, NP  ? ? ?Physical Exam ?Vitals:  ?Vitals:  ? 09/04/21 1057  ?BP: 120/72  ? ?No LMP recorded. (Menstrual status: IUD). ? ?General: NAD ?HEENT: normocephalic, anicteric ?Genitourinary: ? External: Normal external female genitalia.  Normal urethral meatus, normal  Bartholin's and Skene's glands.   ? Vagina: Normal vaginal mucosa, no evidence of prolapse.   ? Cervix: Grossly normal in appearance, menses like bleeding present ? Uterus: Non-enlarged, mobile,  normal contour.  No CMT ? Adnexa: ovaries non-enlarged, no adnexal masses ? Rectal: deferred ? Lymphatic: no evidence of inguinal lymphadenopathy ?Extremities: no edema, erythema, or tenderness ?Neurologic: Grossly intact ?Psychiatric: mood appropriate, affect full ? ?IUD Removal ?IUD strings visualized, grasped with ring forceps, initially no movement of device noted and pt reported feeling pain, with pt's permission another attempt made to remove device, device easily removed, intact and shown to pt.  ? ?Assessment: 26 y.o. G0P0000 IUD removal, prescription for contraception.  ? ?Plan: ?Problem List Items Addressed This Visit   ?None ?Visit Diagnoses   ? ? Encounter for IUD removal    -   Primary  ? Encounter for other contraceptive management      ? Relevant Medications  ? etonogestrel-ethinyl estradiol (NUVARING) 0.12-0.015 MG/24HR vaginal ring  ? Screening examination for venereal disease      ? Relevant Orders  ? Cervicovaginal ancillary only  ? Menorrhagia with regular cycle      ? ?  ? ? ?-will start [place] on Sunday, encouraged pt to trial this medication for at least 3 to 6 months, if she is not happy she may return to try another form of birth control. Risk/benefits/side effects reviewed.  ? ?-Annual exam due in June  ? ?Roberto Scales, CNM  ?Mosetta Pigeon, [place]  ?09/04/21  ?12:59 PM  ? ?  ?

## 2021-09-07 LAB — CERVICOVAGINAL ANCILLARY ONLY
Chlamydia: NEGATIVE
Comment: NEGATIVE
Comment: NORMAL
Neisseria Gonorrhea: NEGATIVE

## 2021-09-08 ENCOUNTER — Ambulatory Visit: Payer: BC Managed Care – PPO | Admitting: Obstetrics and Gynecology

## 2021-10-22 ENCOUNTER — Other Ambulatory Visit: Payer: Self-pay | Admitting: Licensed Practical Nurse

## 2021-10-22 DIAGNOSIS — Z308 Encounter for other contraceptive management: Secondary | ICD-10-CM

## 2021-11-18 NOTE — Telephone Encounter (Signed)
Z30.0

## 2022-07-06 ENCOUNTER — Other Ambulatory Visit: Payer: Self-pay | Admitting: Licensed Practical Nurse

## 2022-07-06 DIAGNOSIS — Z308 Encounter for other contraceptive management: Secondary | ICD-10-CM

## 2022-12-29 ENCOUNTER — Telehealth: Payer: Self-pay

## 2022-12-31 ENCOUNTER — Other Ambulatory Visit: Payer: Self-pay | Admitting: Licensed Practical Nurse

## 2022-12-31 DIAGNOSIS — Z308 Encounter for other contraceptive management: Secondary | ICD-10-CM

## 2022-12-31 NOTE — Telephone Encounter (Signed)
Added to PA Spreadsheet [facility]) tab

## 2023-01-06 NOTE — Telephone Encounter (Signed)
Pre-authorization has been sent to plan for review.KW
